# Patient Record
Sex: Female | Born: 2005 | Race: White | Hispanic: Yes | Marital: Single | Smoking: Never smoker
Health system: Southern US, Community
[De-identification: ages and names within clinical notes are randomized; demographics above are authoritative.]

## PROBLEM LIST (undated history)

## (undated) DIAGNOSIS — L309 Dermatitis, unspecified: Secondary | ICD-10-CM

## (undated) DIAGNOSIS — J45909 Unspecified asthma, uncomplicated: Secondary | ICD-10-CM

## (undated) DIAGNOSIS — J309 Allergic rhinitis, unspecified: Secondary | ICD-10-CM

## (undated) DIAGNOSIS — S3992XA Unspecified injury of lower back, initial encounter: Secondary | ICD-10-CM

## (undated) HISTORY — PX: NO PAST SURGERIES: SHX2092

## (undated) HISTORY — DX: Allergic rhinitis, unspecified: J30.9

## (undated) HISTORY — DX: Unspecified asthma, uncomplicated: J45.909

## (undated) HISTORY — DX: Unspecified injury of lower back, initial encounter: S39.92XA

## (undated) HISTORY — DX: Dermatitis, unspecified: L30.9

---
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
## 2023-01-24 ENCOUNTER — Encounter (HOSPITAL_COMMUNITY): Payer: Self-pay

## 2023-01-24 ENCOUNTER — Encounter: Payer: Self-pay | Admitting: Pediatrics

## 2023-01-24 ENCOUNTER — Other Ambulatory Visit: Payer: Self-pay | Admitting: Pediatrics

## 2023-01-24 ENCOUNTER — Other Ambulatory Visit: Payer: Self-pay

## 2023-01-24 ENCOUNTER — Ambulatory Visit (INDEPENDENT_AMBULATORY_CARE_PROVIDER_SITE_OTHER): Payer: Medicaid Other | Admitting: Pediatrics

## 2023-01-24 ENCOUNTER — Emergency Department (HOSPITAL_COMMUNITY)
Admission: EM | Admit: 2023-01-24 | Discharge: 2023-01-24 | Disposition: A | Discharge status: Home / Self Care | Payer: Medicaid Other | Attending: Emergency Medicine | Admitting: Emergency Medicine

## 2023-01-24 VITALS — Wt 136.1 lb

## 2023-01-24 DIAGNOSIS — R109 Unspecified abdominal pain: Secondary | ICD-10-CM

## 2023-01-24 DIAGNOSIS — K921 Melena: Secondary | ICD-10-CM | POA: Insufficient documentation

## 2023-01-24 DIAGNOSIS — Q181 Preauricular sinus and cyst: Secondary | ICD-10-CM | POA: Diagnosis not present

## 2023-01-24 DIAGNOSIS — J45909 Unspecified asthma, uncomplicated: Secondary | ICD-10-CM | POA: Insufficient documentation

## 2023-01-24 LAB — POCT URINALYSIS DIPSTICK
Bilirubin, UA: NEGATIVE
Blood, UA: NEGATIVE
Glucose, UA: NEGATIVE
Ketones, UA: NEGATIVE
Nitrite, UA: NEGATIVE
Protein, UA: NEGATIVE
Spec Grav, UA: 1.01 (ref 1.010–1.025)
Urobilinogen, UA: NEGATIVE E.U./dL — AB
pH, UA: 8 — AB (ref 5.0–8.0)

## 2023-01-24 LAB — CBC WITH DIFFERENTIAL/PLATELET
Abs Immature Granulocytes: 0.03 10*3/uL (ref 0.00–0.07)
Basophils Absolute: 0 10*3/uL (ref 0.0–0.1)
Basophils Relative: 0 %
Eosinophils Absolute: 0.1 10*3/uL (ref 0.0–0.5)
Eosinophils Relative: 1 %
HCT: 37.4 % (ref 36.0–46.0)
Hemoglobin: 12.8 g/dL (ref 12.0–15.0)
Immature Granulocytes: 0 %
Lymphocytes Relative: 24 %
Lymphs Abs: 2.1 10*3/uL (ref 0.7–4.0)
MCH: 31.8 pg (ref 26.0–34.0)
MCHC: 34.2 g/dL (ref 30.0–36.0)
MCV: 92.8 fL (ref 80.0–100.0)
Monocytes Absolute: 0.5 10*3/uL (ref 0.1–1.0)
Monocytes Relative: 5 %
Neutro Abs: 6.3 10*3/uL (ref 1.7–7.7)
Neutrophils Relative %: 70 %
Platelets: 223 10*3/uL (ref 150–400)
RBC: 4.03 MIL/uL (ref 3.87–5.11)
RDW: 11.6 % (ref 11.5–15.5)
WBC: 9.1 10*3/uL (ref 4.0–10.5)
nRBC: 0 % (ref 0.0–0.2)

## 2023-01-24 LAB — COMPREHENSIVE METABOLIC PANEL
ALT: 14 U/L (ref 0–44)
AST: 15 U/L (ref 15–41)
Albumin: 4.3 g/dL (ref 3.5–5.0)
Alkaline Phosphatase: 41 U/L (ref 38–126)
Anion gap: 7 (ref 5–15)
BUN: 20 mg/dL (ref 6–20)
CO2: 25 mmol/L (ref 22–32)
Calcium: 9.1 mg/dL (ref 8.9–10.3)
Chloride: 104 mmol/L (ref 98–111)
Creatinine, Ser: 0.63 mg/dL (ref 0.44–1.00)
GFR, Estimated: >60 mL/min (ref >60)
Glucose, Bld: 83 mg/dL (ref 70–99)
Potassium: 3.7 mmol/L (ref 3.5–5.1)
Sodium: 136 mmol/L (ref 135–145)
Total Bilirubin: 0.9 mg/dL (ref 0.3–1.2)
Total Protein: 7.3 g/dL (ref 6.5–8.1)

## 2023-01-24 LAB — URINALYSIS, ROUTINE W REFLEX MICROSCOPIC
Bilirubin Urine: NEGATIVE
Glucose, UA: NEGATIVE mg/dL
Hgb urine dipstick: NEGATIVE
Ketones, ur: NEGATIVE mg/dL
Leukocytes,Ua: NEGATIVE
Nitrite: NEGATIVE
Protein, ur: NEGATIVE mg/dL
Specific Gravity, Urine: 1.015 (ref 1.005–1.030)
pH: 7 (ref 5.0–8.0)

## 2023-01-24 LAB — LIPASE, BLOOD: Lipase: 23 U/L (ref 11–51)

## 2023-01-24 MED ORDER — PANTOPRAZOLE SODIUM 40 MG PO TBEC: 40.0000 mg | DELAYED_RELEASE_TABLET | Freq: Two times a day (BID) | ORAL | 0 refills | Status: AC

## 2023-01-24 MED ORDER — PANTOPRAZOLE SODIUM 40 MG IV SOLR
40.0000 mg | Freq: Once | INTRAVENOUS | Status: AC
  Administered 2023-01-24: 40 mg via INTRAVENOUS
  Filled 2023-01-24: qty 10

## 2023-01-24 NOTE — Progress Notes (Unsigned)
Subjective:    Amanda Haney is a 17 y.o. old female here with her  self  for Abdominal Pain (PATIENT STATES LACK OF BM AND SHE'S CONCERN WITH A DARK BM SUNDAY AFTERNOON ) .    HPI Chief Complaint  Patient presents with  . Abdominal Pain    PATIENT STATES LACK OF BM AND SHE'S CONCERN WITH A DARK BM SUNDAY AFTERNOON    17yo here for abdominal pain.  Yesterday pt had black stools.  Pt states she has abd pain/ stomach cramps (nonspecific) when she doesn't have a BM in a long time.  Over the past 2-3wks it has been getting worse.  Pt was seen several months ago for abd pain and constipation.  Pt went to [place] in march- and since then has not had regular BM.  Pt attempted to change her diet as well, but no changes. She has been using miralax for BM.  She has not used in 65mo.  Yesterday she had a BM that was very black.  She has noticed a different smell to her stools.  Pt denies being sexually active.  Pt denies eating lots of acidic or spicy foods.  Pt has been eating more yogurts  Pt does have weight loss-  but she has been eating better and working out more.    Bump behind her L ear x 6mos. Pt states it does not bother her, but it is getting bigger.    Review of Systems  History and Problem List: Amanda Haney has Allergic rhinitis; Family history of elevated blood lipids; Verruca plantaris; Insomnia; Acne vulgaris; Mild intermittent asthma without complication; Sleep-disordered breathing; and Tonsillar hypertrophy on their problem list.  Amanda Haney  has a past medical history of Allergic rhinitis and Asthma.  Immunizations needed: {NONE DEFAULTED:18576}     Objective:    Wt 136 lb 2 oz (61.7 kg)   BMI 24.11 kg/m  Physical Exam Constitutional:      Appearance: She is well-developed.  HENT:     Head:     Comments: Behind L ear- superior - soft, nonmobile, fluctuance 1cm x 1cm,  nontender, nonerythematous.    Right Ear: Tympanic membrane and external ear normal.     Left Ear: Tympanic membrane  and external ear normal.     Nose: Nose normal.     Mouth/Throat:     Mouth: Mucous membranes are moist.  Eyes:     Pupils: Pupils are equal, round, and reactive to light.  Cardiovascular:     Rate and Rhythm: Normal rate and regular rhythm.     Pulses: Normal pulses.     Heart sounds: Normal heart sounds.  Pulmonary:     Effort: Pulmonary effort is normal.     Breath sounds: Normal breath sounds.  Abdominal:     General: Bowel sounds are normal.     Palpations: Abdomen is soft.     Tenderness: There is generalized abdominal tenderness.  Genitourinary:    Adnexa:        Right: Masses: but predominantly periumbilical.   Musculoskeletal:        General: Normal range of motion.     Cervical back: Normal range of motion.  Skin:    Capillary Refill: Capillary refill takes less than 2 seconds.  Neurological:     Mental Status: She is alert.  Psychiatric:        Mood and Affect: Mood normal.       Assessment and Plan:   Amanda Haney is a 17 y.o.  old female with  1. Abdominal pain, unspecified abdominal location *** - POCT urinalysis dipstick - POCT occult blood stool - CALPROTECTIN - H. pylori antigen, stool  2. Melena *** - POCT occult blood stool - Ambulatory referral to Gastroenterology  3. Cyst on ear *** - Ambulatory referral to Pediatric Dermatology  Advised to go to ER for further eval. Pt has positive hemoccult.  Tarry stools obtained today to send out for calprotectin, h. Pylori.  Pt may need CT abdomen or upper endoscopy to rule out UGI bleed.   No follow-ups on file.  Marjory Sneddon, MD

## 2023-01-24 NOTE — ED Notes (Signed)
Pt alert, NAD, calm, interactive, resps e/u. Endorses black stool with hemoccult positive. Instructed to come to ED. Denies pain, sob, nausea, dizziness, NVD, fever, recent foreign travel.

## 2023-01-24 NOTE — ED Provider Notes (Signed)
[place] EMERGENCY DEPARTMENT AT [facility] Provider Note   CSN: [id number] Arrival date & time: 01/24/23  1240     History  Chief Complaint  Patient presents with   Abdominal Pain   Blood In Stools    Amanda Haney is a 17 y.o. female.  18 year old female presents with her mom for evaluation of melanotic stools from PCP office.  She had a FOBT positive test.  She states for the past couple days she has had some cramping abdominal pain and yesterday she noted melanotic stools.  Denies dysuria or other GU symptoms.  No prior history of melanotic stools, GI bleed.  She states she does have some pain with defecating and some bright red blood with wiping.  Denies use of NSAIDs, and any over-the-counter supplements including iron.  The history is provided by the patient. No language interpreter was used.       Home Medications Prior to Admission medications   Medication Sig Start Date End Date Taking? Authorizing Provider  albuterol (VENTOLIN HFA) 108 (90 Base) MCG/ACT inhaler Inhale 2 puffs into the lungs every 6 (six) hours as needed for wheezing or shortness of breath. 08/10/22   Waldon Merl, PA-C  cetirizine (ZYRTEC) 10 MG tablet TAKE 1 TABLET(10 MG) BY MOUTH DAILY Patient not taking: Reported on 01/24/2023 05/12/21   Ettefagh, Aron Baba, MD  fluticasone [facility]) 50 MCG/ACT nasal spray Place 2 sprays into both nostrils daily. 05/21/22   Delorse Lek, FNP  methylPREDNISolone (MEDROL DOSEPAK) 4 MG TBPK tablet Follow instructions on the pamphlet. Patient not taking: Reported on 01/24/2023 11/10/22   Scarlette Ar, MD  polyethylene glycol powder (GLYCOLAX/MIRALAX) 17 GM/SCOOP powder Mix 1 capful in 8 oz of fluids and drink 2 times a day until soft stool and titrate dose as instructed by physician 09/24/22   Maree Erie, MD      Allergies    Patient has no known allergies.    Review of Systems   Review of Systems  Constitutional:  Negative for fever.   Gastrointestinal:  Positive for abdominal pain, constipation, nausea and vomiting.  Genitourinary:  Negative for dysuria and flank pain.  Neurological:  Negative for light-headedness.  All other systems reviewed and are negative.   Physical Exam Updated Vital Signs BP 134/82 (BP Location: Left Arm)   Pulse 82   Temp 97.8 F (36.6 C) (Oral)   Resp 14   Ht 5\' 3"  (1.6 m)   Wt 61.7 kg   LMP 01/04/2023   SpO2 100%   BMI 24.10 kg/m  Physical Exam Vitals and nursing note reviewed.  Constitutional:      General: She is not in acute distress.    Appearance: Normal appearance. She is not ill-appearing.  HENT:     Head: Normocephalic and atraumatic.     Nose: Nose normal.  Eyes:     General: No scleral icterus.    Extraocular Movements: Extraocular movements intact.     Conjunctiva/sclera: Conjunctivae normal.  Cardiovascular:     Rate and Rhythm: Normal rate and regular rhythm.     Pulses: Normal pulses.     Heart sounds: Normal heart sounds.  Pulmonary:     Effort: Pulmonary effort is normal. No respiratory distress.     Breath sounds: Normal breath sounds. No wheezing or rales.  Abdominal:     General: There is no distension.     Tenderness: There is abdominal tenderness. There is no right CVA tenderness, left CVA tenderness,  guarding or rebound.  Musculoskeletal:        General: Normal range of motion.     Cervical back: Normal range of motion.  Skin:    General: Skin is warm and dry.  Neurological:     General: No focal deficit present.     Mental Status: She is alert and oriented to person, place, and time. Mental status is at baseline.     ED Results / Procedures / Treatments   Labs (all labs ordered are listed, but only abnormal results are displayed) Labs Reviewed  CBC WITH DIFFERENTIAL/PLATELET  COMPREHENSIVE METABOLIC PANEL  LIPASE, BLOOD  URINALYSIS, ROUTINE W REFLEX MICROSCOPIC    EKG None  Radiology No results found.  Procedures Procedures     Medications Ordered in ED Medications - No data to display  ED Course/ Medical Decision Making/ A&P                             Medical Decision Making Amount and/or Complexity of Data Reviewed Labs: ordered.  Risk Prescription drug management.   Medical Decision Making / ED Course   This patient presents to the ED for concern of abdominal pain, melanotic stools, this involves an extensive number of treatment options, and is a complaint that carries with it a high risk of complications and morbidity.  The differential diagnosis includes upper GI bleed, hemorrhoids, anal fissure  MDM: -year-old female presents today for evaluation of abdominal pain, melanotic stools for the past couple days.  Seen by PCP and referred to the emergency department after fecal occult blood test came back positive.  Has pictures on her phone that she shows me of her melanotic stools.  They are tarry in appearance.  Initial vitals are stable.  No sign of hemodynamic instability.  Denies lightheadedness.  CBC is unremarkable.  CMP is unremarkable.  UA without any concerns of UTI.  Lipase within normal limit.  Will discuss with gastroenterology and hopes to get her plugged into our office sooner rather than later.  Discussed with Dr. Ewing Schlein of gastroenterology.  Recommends discharge on Protonix, clear liquid diet, and to call his office in the morning for an urgent visit.  Patient and mom agreeable with this.  Discharged in appropriate condition.  Strict return precautions discussed.  Lab Tests: -I ordered, reviewed, and interpreted labs.   The pertinent results include:   Labs Reviewed  CBC WITH DIFFERENTIAL/PLATELET  COMPREHENSIVE METABOLIC PANEL  LIPASE, BLOOD  URINALYSIS, ROUTINE W REFLEX MICROSCOPIC      EKG  EKG Interpretation Date/Time:    Ventricular Rate:    PR Interval:    QRS Duration:    QT Interval:    QTC Calculation:   R Axis:      Text Interpretation:           Medicines ordered and prescription drug management: No orders of the defined types were placed in this encounter.   -I have reviewed the patients home medicines and have made adjustments as needed  Critical interventions Protonix, GI consult  Consultations Obtained: I requested consultation with the GI ,  and discussed lab and imaging findings as well as pertinent plan - they recommend: as above   Reevaluation: After the interventions noted above, I reevaluated the patient and found that they have :stayed the same  Co morbidities that complicate the patient evaluation  Past Medical History:  Diagnosis Date   Allergic rhinitis    Asthma  Dispostion: Discharged in stable condition.  Return precaution discussed.  Patient voices understanding and is in agreement with plan.  Final Clinical Impression(s) / ED Diagnoses Final diagnoses:  Melanotic stools    Rx / DC Orders ED Discharge Orders          Ordered    pantoprazole (PROTONIX) 40 MG tablet  2 times daily before meals        01/24/23 1812              Marita [place], PA-C 01/24/23 1815    Gerhard Munch, MD 01/26/23 [id number]

## 2023-01-24 NOTE — Discharge Instructions (Addendum)
Your workup today was overall reassuring.  No concerning cause of significant internal bleeding.  Your hemoglobin was stable.  I discussed your case with the on-call gastroenterologist Dr. Ewing Schlein.  He recommended prescribing you Protonix, and having you call his office tomorrow morning for an urgent office visit.  In the meantime please stick to a clear liquid diet. His information is listed above.  For any concerning symptoms return to the emergency department.

## 2023-01-24 NOTE — ED Triage Notes (Signed)
Pt arrived from pcp office. States was there due to black tools x2 days.States stool was positive for blood and pcp sent her to ED for further eval. Endorses abdominal pain, lower for the last few days. States has also been feeling fatigue. Denies shob,fever,cp or any other symptoms

## 2023-01-25 DIAGNOSIS — K921 Melena: Secondary | ICD-10-CM | POA: Diagnosis not present

## 2023-01-25 DIAGNOSIS — K5900 Constipation, unspecified: Secondary | ICD-10-CM | POA: Diagnosis not present

## 2023-01-25 DIAGNOSIS — R195 Other fecal abnormalities: Secondary | ICD-10-CM | POA: Diagnosis not present

## 2023-01-27 DIAGNOSIS — B9681 Helicobacter pylori [H. pylori] as the cause of diseases classified elsewhere: Secondary | ICD-10-CM | POA: Diagnosis not present

## 2023-01-27 DIAGNOSIS — R1013 Epigastric pain: Secondary | ICD-10-CM | POA: Diagnosis not present

## 2023-01-27 DIAGNOSIS — K2930 Chronic superficial gastritis without bleeding: Secondary | ICD-10-CM | POA: Diagnosis not present

## 2023-01-27 DIAGNOSIS — K922 Gastrointestinal hemorrhage, unspecified: Secondary | ICD-10-CM | POA: Diagnosis not present

## 2023-01-27 DIAGNOSIS — K269 Duodenal ulcer, unspecified as acute or chronic, without hemorrhage or perforation: Secondary | ICD-10-CM | POA: Diagnosis not present

## 2023-02-09 ENCOUNTER — Ambulatory Visit: Payer: Medicaid Other | Admitting: Medical

## 2023-02-17 ENCOUNTER — Ambulatory Visit: Payer: Medicaid Other | Admitting: Nurse Practitioner

## 2023-02-24 ENCOUNTER — Ambulatory Visit: Payer: Medicaid Other | Admitting: Dermatology

## 2023-04-06 ENCOUNTER — Ambulatory Visit: Payer: Medicaid Other | Admitting: Nurse Practitioner

## 2023-05-23 ENCOUNTER — Ambulatory Visit: Payer: Medicaid Other | Admitting: Dermatology

## 2023-06-18 IMAGING — US US PELVIS COMPLETE WITH TRANSVAGINAL
1 series · 14 of 25 positions shown · non-contrast
Comparison: None Available.

CLINICAL DATA: Pelvic pain.

EXAM:
TRANSABDOMINAL ULTRASOUND OF PELVIS
DOPPLER ULTRASOUND OF OVARIES
TECHNIQUE: Transabdominal ultrasound examination of the pelvis was performed
including evaluation of the uterus, ovaries, adnexal regions, and
pelvic cul-de-sac.
Color and duplex Doppler ultrasound was utilized to evaluate blood
flow to the ovaries.
Transabdominal ultrasound examination of the pelvis was performed
pelvic cul-de-sac. Patient refused trans vaginal examination.

[Series 1: us pelvis complete with transvaginal · 14 of 50 slices shown]
[im 1/50]
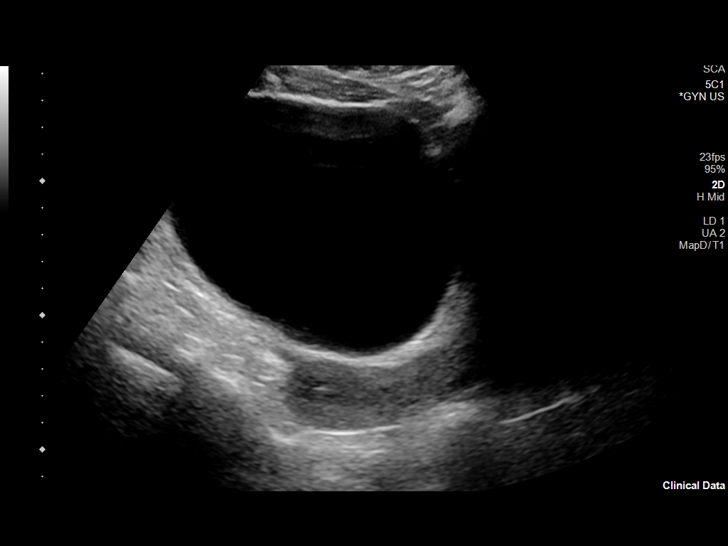
[im 5/50]
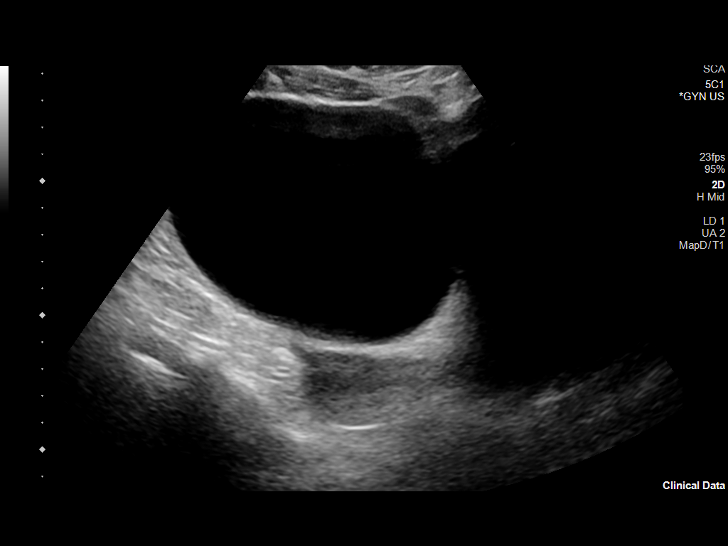
[im 9/50]
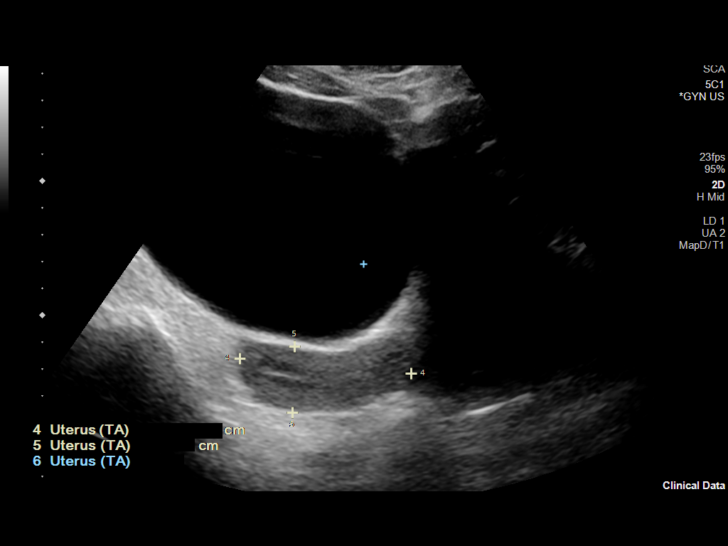
[im 13/50]
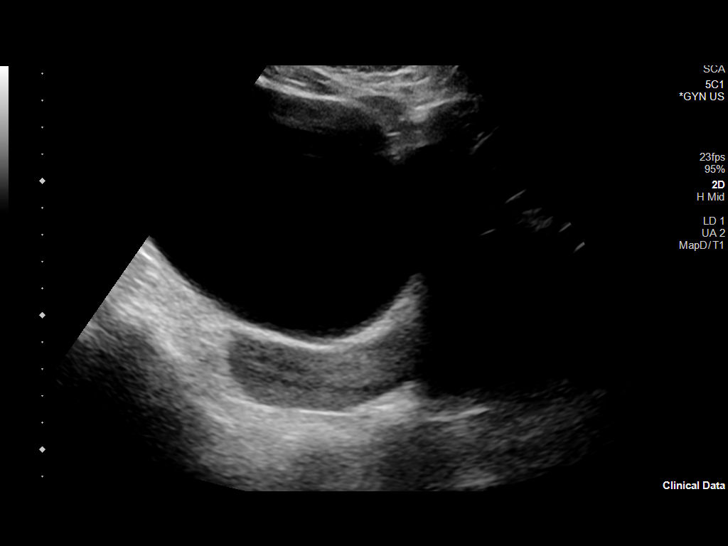
[im 17/50]
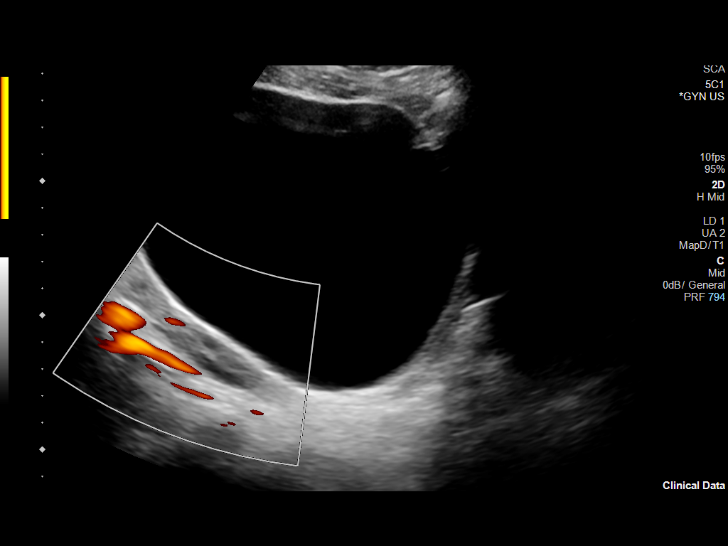
[im 19/50]
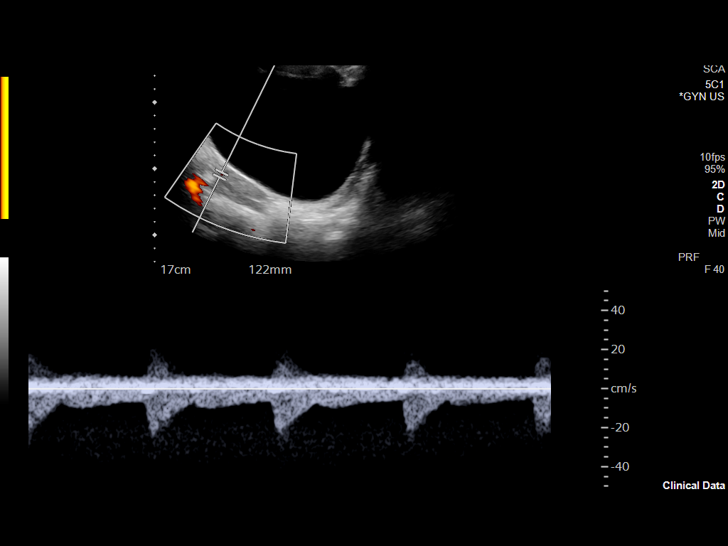
[im 23/50]
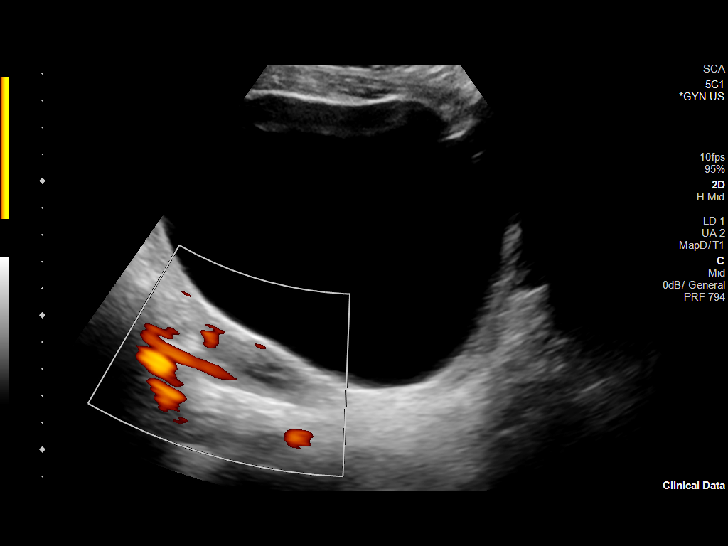
[im 27/50]
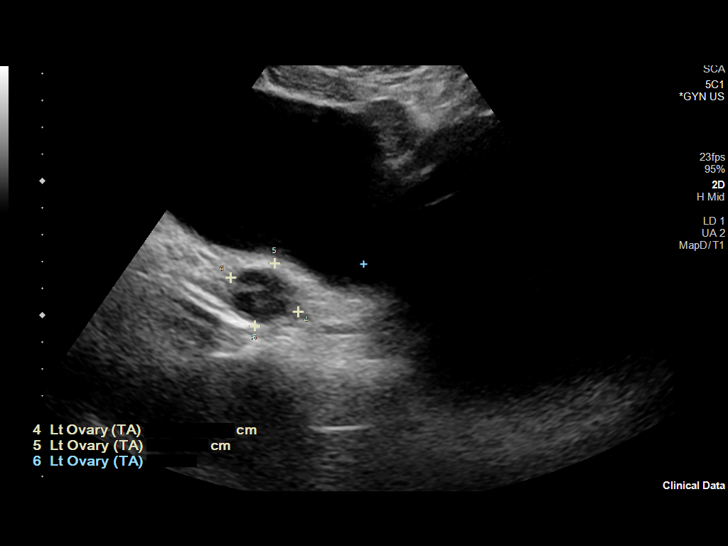
[im 31/50]
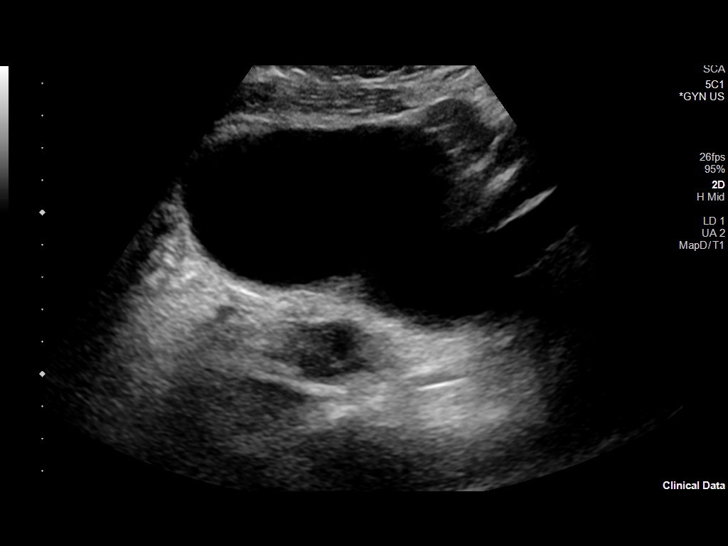
[im 33/50]
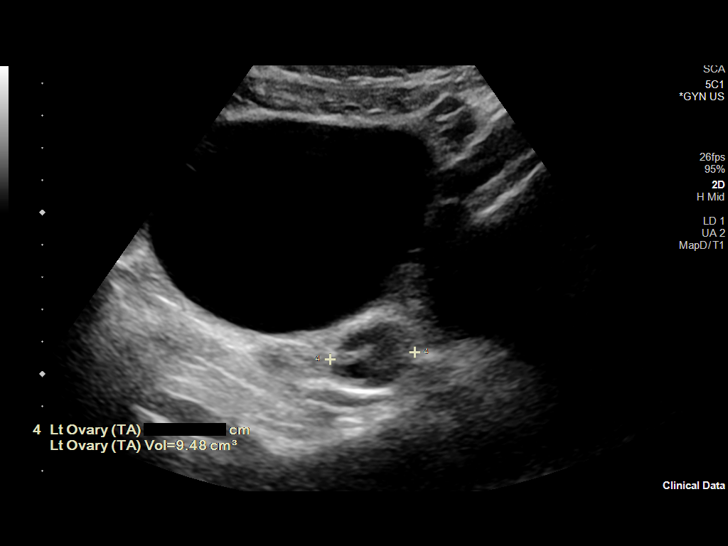
[im 37/50]
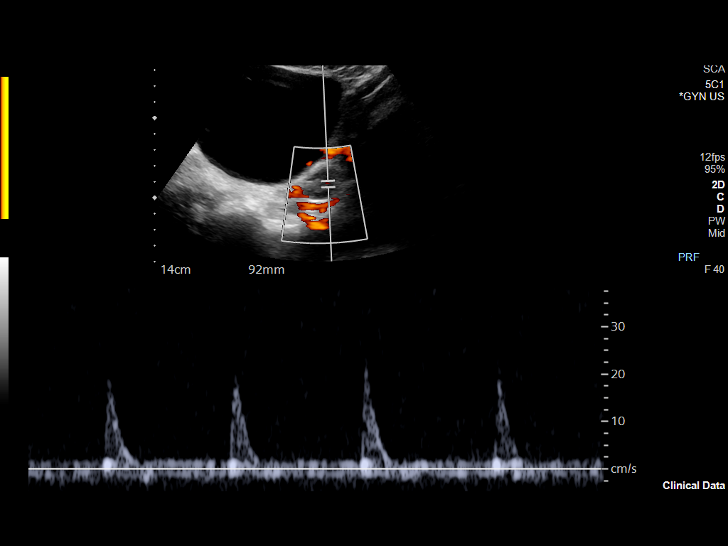
[im 41/50]
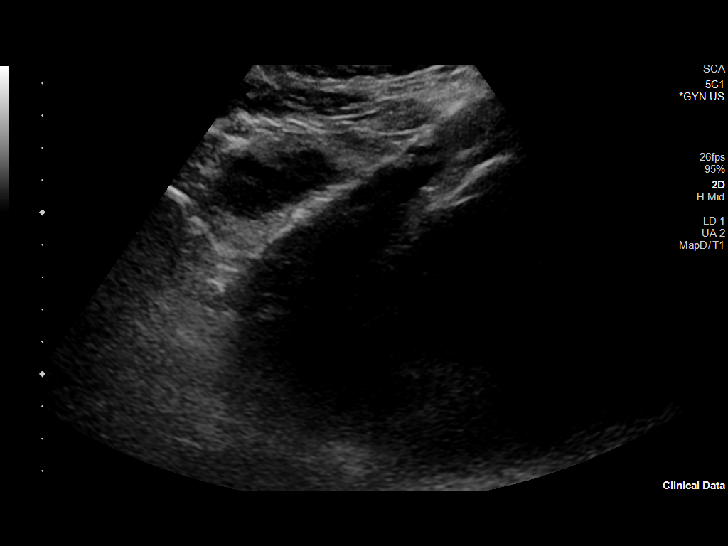
[im 45/50]
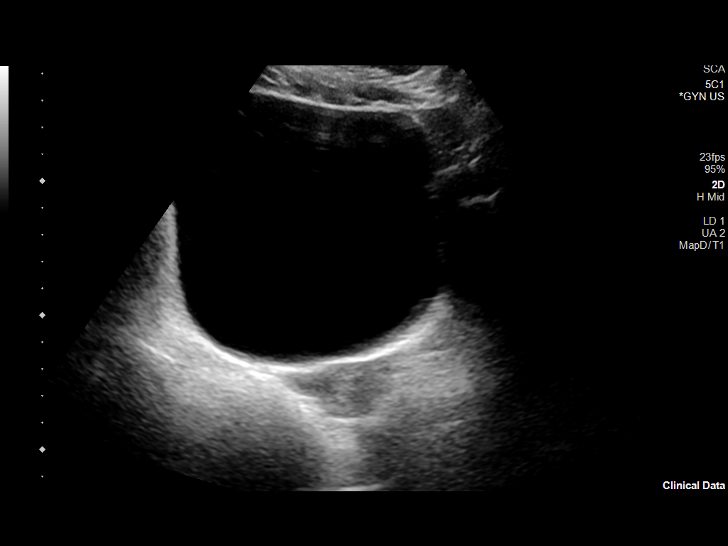
[im 50/50]
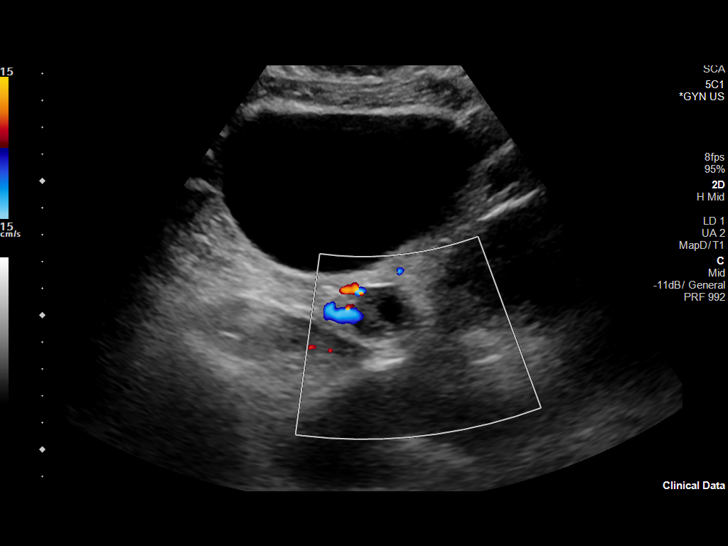

[14 of 25 positions shown; findings below may reference images not displayed]

FINDINGS: Uterus:

Measurements: 6.4 x 2.5 x 3.3 = volume: 26.9 mL. No fibroids or
other mass visualized.

Endometrium

Thickness: 5 mm.  No focal abnormality visualized.

Right ovary

Measurements: 4.9 x 1.3 x 2.2 = volume: 7.6 mL. Normal appearance/no
adnexal mass.

Left ovary

Measurements: 2.8 x 2.4 x 2.6 = volume: 9.5 mL. Normal appearance/no
adnexal mass.

Pulsed Doppler evaluation demonstrates normal low-resistance
arterial and venous waveforms in both ovaries.

Other: No free fluid in the dependent pelvis.
IMPRESSION: 1.  Bilateral ovaries are normal in size with normal blood flow.

2.  Normal examination of the uterus.

## 2023-08-05 ENCOUNTER — Emergency Department (HOSPITAL_BASED_OUTPATIENT_CLINIC_OR_DEPARTMENT_OTHER): Payer: Medicaid Other

## 2023-08-05 ENCOUNTER — Emergency Department (HOSPITAL_BASED_OUTPATIENT_CLINIC_OR_DEPARTMENT_OTHER)
Admission: EM | Admit: 2023-08-05 | Discharge: 2023-08-05 | Disposition: A | Discharge status: Home / Self Care | Payer: Medicaid Other | Attending: Emergency Medicine | Admitting: Emergency Medicine

## 2023-08-05 ENCOUNTER — Other Ambulatory Visit: Payer: Self-pay

## 2023-08-05 ENCOUNTER — Ambulatory Visit (INDEPENDENT_AMBULATORY_CARE_PROVIDER_SITE_OTHER): Payer: Medicaid Other | Admitting: Pediatrics

## 2023-08-05 ENCOUNTER — Encounter (HOSPITAL_BASED_OUTPATIENT_CLINIC_OR_DEPARTMENT_OTHER): Payer: Self-pay | Admitting: Emergency Medicine

## 2023-08-05 VITALS — Temp 98.5°F | Wt 140.4 lb

## 2023-08-05 DIAGNOSIS — R1032 Left lower quadrant pain: Secondary | ICD-10-CM | POA: Diagnosis not present

## 2023-08-05 DIAGNOSIS — R112 Nausea with vomiting, unspecified: Secondary | ICD-10-CM | POA: Insufficient documentation

## 2023-08-05 DIAGNOSIS — R509 Fever, unspecified: Secondary | ICD-10-CM | POA: Diagnosis not present

## 2023-08-05 DIAGNOSIS — Z3202 Encounter for pregnancy test, result negative: Secondary | ICD-10-CM | POA: Diagnosis not present

## 2023-08-05 DIAGNOSIS — R197 Diarrhea, unspecified: Secondary | ICD-10-CM | POA: Diagnosis not present

## 2023-08-05 LAB — CBC
HCT: 40.1 % (ref 36.0–46.0)
Hemoglobin: 13.9 g/dL (ref 12.0–15.0)
MCH: 29.1 pg (ref 26.0–34.0)
MCHC: 34.7 g/dL (ref 30.0–36.0)
MCV: 84.1 fL (ref 80.0–100.0)
Platelets: 227 10*3/uL (ref 150–400)
RBC: 4.77 MIL/uL (ref 3.87–5.11)
RDW: 13.2 % (ref 11.5–15.5)
WBC: 10.1 10*3/uL (ref 4.0–10.5)
nRBC: 0 % (ref 0.0–0.2)

## 2023-08-05 LAB — COMPREHENSIVE METABOLIC PANEL
ALT: 23 U/L (ref 0–44)
AST: 64 U/L — ABNORMAL HIGH (ref 15–41)
Albumin: 4.2 g/dL (ref 3.5–5.0)
Alkaline Phosphatase: 48 U/L (ref 38–126)
Anion gap: 10 (ref 5–15)
BUN: 13 mg/dL (ref 6–20)
CO2: 23 mmol/L (ref 22–32)
Calcium: 8.8 mg/dL — ABNORMAL LOW (ref 8.9–10.3)
Chloride: 100 mmol/L (ref 98–111)
Creatinine, Ser: 0.68 mg/dL (ref 0.44–1.00)
GFR, Estimated: >60 mL/min (ref >60)
Glucose, Bld: 101 mg/dL — ABNORMAL HIGH (ref 70–99)
Potassium: 3.5 mmol/L (ref 3.5–5.1)
Sodium: 133 mmol/L — ABNORMAL LOW (ref 135–145)
Total Bilirubin: 1.2 mg/dL (ref 0.0–1.2)
Total Protein: 7.5 g/dL (ref 6.5–8.1)

## 2023-08-05 LAB — POCT URINALYSIS DIPSTICK
Bilirubin, UA: NEGATIVE
Blood, UA: POSITIVE
Glucose, UA: NEGATIVE
Ketones, UA: 50
Leukocytes, UA: NEGATIVE
Nitrite, UA: NEGATIVE
Protein, UA: NEGATIVE
Spec Grav, UA: 1.025 (ref 1.010–1.025)
Urobilinogen, UA: NEGATIVE U/dL — AB
pH, UA: 5 (ref 5.0–8.0)

## 2023-08-05 LAB — POC SOFIA 2 FLU + SARS ANTIGEN FIA
Influenza A, POC: NEGATIVE
Influenza B, POC: NEGATIVE
SARS Coronavirus 2 Ag: NEGATIVE

## 2023-08-05 LAB — POCT URINE PREGNANCY: Preg Test, Ur: NEGATIVE

## 2023-08-05 LAB — LIPASE, BLOOD: Lipase: 21 U/L (ref 11–51)

## 2023-08-05 LAB — POCT RAPID STREP A (OFFICE): Rapid Strep A Screen: NEGATIVE

## 2023-08-05 MED ORDER — ONDANSETRON HCL 4 MG/2ML IJ SOLN
4.0000 mg | Freq: Once | INTRAMUSCULAR | Status: DC
  Filled 2023-08-05: qty 2

## 2023-08-05 MED ORDER — ACETAMINOPHEN 500 MG PO TABS: 500.0000 mg | ORAL_TABLET | Freq: Four times a day (QID) | ORAL | 0 refills | Status: AC | PRN

## 2023-08-05 MED ORDER — ACETAMINOPHEN 500 MG PO TABS
1000.0000 mg | ORAL_TABLET | Freq: Once | ORAL | Status: AC
  Administered 2023-08-05: 1000 mg via ORAL
  Filled 2023-08-05: qty 2

## 2023-08-05 MED ORDER — SODIUM CHLORIDE 0.9 % IV BOLUS
1000.0000 mL | Freq: Once | INTRAVENOUS | Status: AC
  Administered 2023-08-05: 1000 mL via INTRAVENOUS

## 2023-08-05 MED ORDER — ONDANSETRON HCL 4 MG PO TABS: 4.0000 mg | ORAL_TABLET | Freq: Three times a day (TID) | ORAL | 0 refills | Status: AC | PRN

## 2023-08-05 MED ORDER — IOHEXOL 300 MG/ML  SOLN
100.0000 mL | Freq: Once | INTRAMUSCULAR | Status: AC | PRN
  Administered 2023-08-05: 100 mL via INTRAVENOUS

## 2023-08-05 NOTE — Patient Instructions (Addendum)
Pt is advised to go to ER for further evaluation.  Pt c/o LL abdominal pain in the setting of fever, vomiting and diarrhea.  Can not rule out constipation vs ovarian cyst vs ovarian torsion vs renal calicul in clinic.

## 2023-08-05 NOTE — ED Triage Notes (Addendum)
 PtPOV c/o lower abd pain, back pain, fever, diarrhea, emesis x1 day. Seen at pcp today, referred to ED.   UA/ resp panel obtained at PCP, results available.

## 2023-08-05 NOTE — Progress Notes (Signed)
 Subjective:    Amanda Haney is a 18 y.o. old female here with her mother for Emesis (Vomiting and diarrhea pain in lower left abdomin and lower back, warm to touch all symptoms started last night. Also body chill feels dehydrated. ) .    HPI Chief Complaint  Patient presents with   Emesis    Vomiting and diarrhea pain in lower left abdomin and lower back, warm to touch all symptoms started last night. Also body chill feels dehydrated.    17yo here for V/D since last night.  Pt c/o LL abd pain.  Hurts to touch and feels in LL back. Now starting to have mild right sided pain.  No vomiting today.  And fevers. No tyl/motrin  given.  Pt has had HA.  Pt has h/o constipation  Yesterday ate avocado,bean, corn dip w/ chips, apple, keto bar.    Review of Systems  Constitutional:  Positive for appetite change.    History and Problem List: Amanda Haney has Allergic rhinitis; Family history of elevated blood lipids; Verruca plantaris; Insomnia; Acne vulgaris; Mild intermittent asthma without complication; Sleep-disordered breathing; and Tonsillar hypertrophy on their problem list.  Amanda Haney  has a past medical history of Allergic rhinitis and Asthma.  Immunizations needed: none     Objective:    Temp 98.5 F (36.9 C) (Oral)   Wt 140 lb 6.4 oz (63.7 kg)   BMI 24.87 kg/m  Physical Exam Constitutional:      Appearance: She is well-developed.  HENT:     Right Ear: Tympanic membrane and external ear normal.     Left Ear: Tympanic membrane and external ear normal.     Nose: Nose normal.     Mouth/Throat:     Mouth: Mucous membranes are moist.  Eyes:     Pupils: Pupils are equal, round, and reactive to light.  Cardiovascular:     Rate and Rhythm: Normal rate and regular rhythm.     Pulses: Normal pulses.     Heart sounds: Normal heart sounds.  Pulmonary:     Effort: Pulmonary effort is normal.     Breath sounds: Normal breath sounds.  Abdominal:     General: Bowel sounds are normal.      Palpations: Abdomen is soft.  Musculoskeletal:        General: Normal range of motion.     Cervical back: Normal range of motion.  Skin:    Capillary Refill: Capillary refill takes less than 2 seconds.  Neurological:     Mental Status: She is alert.  Psychiatric:        Mood and Affect: Mood normal.        Assessment and Plan:   Amanda Haney is a 18 y.o. old female with  1. Nausea and vomiting, unspecified vomiting type (Primary) Patient presents with signs / symptoms of vomiting. Clinical work up did not reveal a specific etiology of the vomiting.  I discussed the differential diagnosis and work up of vomiting with patient / caregiver. Supportive care recommended, but pt advised to go to ER at this time. Patient remained clinically stable at time of discharge.   Patient / caregiver advised to have medical re-evaluation if symptoms worsen or persist, or if new symptoms develop over the next 24-48 hours.  Pt is advised to go to ER for further evaluation.  Pt c/o LL abdominal pain in the setting of fever, vomiting and diarrhea.  Can not rule out dehydration vs constipation vs ovarian cyst vs ovarian torsion vs renal  calicul vs PID vs pyelo in clinic.    Urinalysis- negative for leuk/nitrites, Urine pregnancy- negative POC strep, flu, covid- NEG.   - POCT urine pregnancy - POCT urinalysis dipstick  2. Fever, unspecified fever cause  - POC SOFIA 2 FLU + SARS ANTIGEN FIA-NEG - POCT rapid strep A-NEG    No follow-ups on file.  [place] R Christobal Morado, MD

## 2023-08-05 NOTE — Discharge Instructions (Addendum)
 Your symptom is likely due to a viral gastroenteritis such as norovirus.  Take Zofran  as needed for nausea, take Tylenol  as needed for aches and pain and follow-up closely with your doctor for further care.

## 2023-08-05 NOTE — ED Notes (Signed)
 Pt alert and oriented X 4 at the time of discharge. RR even and unlabored. No acute distress noted. Pt verbalized understanding of discharge instructions as discussed. Pt ambulatory to lobby at time of discharge.

## 2023-08-05 NOTE — ED Provider Notes (Signed)
 [place] [facility] Provider Note   CSN: [id number] Arrival date & time: 08/05/23  1224     History  Chief Complaint  Patient presents with   Abdominal Pain    Amanda Haney is a 18 y.o. female.  The history is provided by the patient and medical records. No language interpreter was used.  Abdominal Pain    18 year old female presenting with complaint of abdominal pain.  Patient report for the past 2 days she has had subjective fever, chills, body aches, headache, nausea vomiting diarrhea and pain to her left lower abdomen.  Pain is described as a cramping sensation radiates to her lower back.  She feels dehydrated.  She just started on her menstruation recently.  She was seen at her PCP office today for her complaint.  States that respiratory panel as well as UA was obtained as well as pregnancy test and all of which was negative.  However her daughter voiced concern regarding her symptoms and recommend patient to come to the ER for further assessment.  Patient states she is not sexually active and voiced no concern for sexual transmitted infection  Home Medications Prior to Admission medications   Medication Sig Start Date End Date Taking? Authorizing Provider  albuterol  (VENTOLIN  HFA) 108 (90 Base) MCG/ACT inhaler Inhale 2 puffs into the lungs every 6 (six) hours as needed for wheezing or shortness of breath. 08/10/22   Gladis Elsie BROCKS, PA-C  cetirizine  (ZYRTEC ) 10 MG tablet TAKE 1 TABLET(10 MG) BY MOUTH DAILY Patient not taking: Reported on 08/05/2023 05/12/21   Ettefagh, Mallie Hamilton, MD  fluticasone  (FLONASE ) 50 MCG/ACT nasal spray Place 2 sprays into both nostrils daily. Patient not taking: Reported on 08/05/2023 05/21/22   Almeda Loa ORN, FNP  methylPREDNISolone  (MEDROL  DOSEPAK) 4 MG TBPK tablet Follow instructions on the pamphlet. Patient not taking: Reported on 08/05/2023 11/10/22   Luciano Standing, MD  pantoprazole  (PROTONIX ) 40 MG tablet Take 1  tablet (40 mg total) by mouth 2 (two) times daily before a meal. Patient not taking: Reported on 08/05/2023 01/24/23   Hildegard Loge, PA-C  polyethylene glycol powder (GLYCOLAX /MIRALAX ) 17 GM/SCOOP powder Mix 1 capful in 8 oz of fluids and drink 2 times a day until soft stool and titrate dose as instructed by physician Patient not taking: Reported on 08/05/2023 09/24/22   Taft Jon PARAS, MD      Allergies    Patient has no known allergies.    Review of Systems   Review of Systems  Gastrointestinal:  Positive for abdominal pain.  All other systems reviewed and are negative.   Physical Exam Updated Vital Signs BP 105/82 (BP Location: Right Arm)   Pulse (!) 107   Temp 98.9 F (37.2 C) (Oral)   Resp 17   Ht 5' 2 (1.575 m)   Wt 63.5 kg   LMP 08/04/2023 (Exact Date)   SpO2 100%   BMI 25.61 kg/m  Physical Exam Vitals and nursing note reviewed.  Constitutional:      General: She is not in acute distress.    Appearance: She is well-developed.  HENT:     Head: Atraumatic.  Eyes:     Conjunctiva/sclera: Conjunctivae normal.  Cardiovascular:     Rate and Rhythm: Regular rhythm. Tachycardia present.     Pulses: Normal pulses.     Heart sounds: Normal heart sounds.  Pulmonary:     Effort: Pulmonary effort is normal.  Abdominal:     Palpations: Abdomen is  soft.     Tenderness: There is abdominal tenderness (Mild tenderness to left lower quadrant no guarding no rebound tenderness).  Genitourinary:    Comments: Pelvic exam deferred Musculoskeletal:     Cervical back: Neck supple.  Skin:    Findings: No rash.  Neurological:     Mental Status: She is alert.  Psychiatric:        Mood and Affect: Mood normal.     ED Results / Procedures / Treatments   Labs (all labs ordered are listed, but only abnormal results are displayed) Labs Reviewed  COMPREHENSIVE METABOLIC PANEL - Abnormal; Notable for the following components:      Result Value   Sodium 133 (*)    Glucose, Bld 101 (*)     Calcium 8.8 (*)    AST 64 (*)    All other components within normal limits  LIPASE, BLOOD  CBC    EKG None  Radiology CT ABDOMEN PELVIS W CONTRAST Result Date: 08/05/2023 CLINICAL DATA:  Left lower quadrant pain beginning yesterday. Nausea and vomiting. Diarrhea. EXAM: CT ABDOMEN AND PELVIS WITH CONTRAST TECHNIQUE: Multidetector CT imaging of the abdomen and pelvis was performed using the standard protocol following bolus administration of intravenous contrast. RADIATION DOSE REDUCTION: This exam was performed according to the departmental dose-optimization program which includes automated exposure control, adjustment of the mA and/or kV according to patient size and/or use of iterative reconstruction technique. CONTRAST:  OMNIPAQUE  IOHEXOL  300 MG/ML  SOLN COMPARISON:  None Available. FINDINGS: Lower Chest: No acute findings. Hepatobiliary: No suspicious hepatic masses identified. Gallbladder is unremarkable. No evidence of biliary ductal dilatation. Pancreas:  No mass or inflammatory changes. Spleen: Within normal limits in size and appearance. Adrenals/Urinary Tract: No suspicious masses identified. No evidence of ureteral calculi or hydronephrosis. Stomach/Bowel: No evidence of obstruction, inflammatory process or abnormal fluid collections. Normal appendix visualized. Vascular/Lymphatic: No pathologically enlarged lymph nodes. No acute vascular findings. Reproductive:  No mass or other significant abnormality. Other:  None. Musculoskeletal:  No suspicious bone lesions identified. IMPRESSION: Negative. No acute findings or other significant abnormality. Electronically Signed   By: Norleen DELENA Kil M.D.   On: 08/05/2023 16:34    Procedures Procedures    Medications Ordered in ED Medications  ondansetron  (ZOFRAN ) injection 4 mg (0 mg Intravenous Hold 08/05/23 1402)  sodium chloride  0.9 % bolus 1,000 mL (0 mLs Intravenous Stopped 08/05/23 1601)  acetaminophen  (TYLENOL ) tablet 1,000 mg (1,000  mg Oral Given 08/05/23 1402)  iohexol  (OMNIPAQUE ) 300 MG/ML solution 100 mL (100 mLs Intravenous Contrast Given 08/05/23 1530)    ED Course/ Medical Decision Making/ A&P                                 Medical Decision Making Amount and/or Complexity of Data Reviewed Labs: ordered. Radiology: ordered.  Risk OTC drugs. Prescription drug management.   BP 105/82 (BP Location: Right Arm)   Pulse (!) 107   Temp 98.9 F (37.2 C) (Oral)   Resp 17   Ht 5' 2 (1.575 m)   Wt 63.5 kg   LMP 08/04/2023 (Exact Date)   SpO2 100%   BMI 25.61 kg/m   55:58 PM  18 year old female presenting with complaint of abdominal pain.  Patient report for the past 2 days she has had subjective fever, chills, body aches, headache, nausea vomiting diarrhea and pain to her left lower abdomen.  Pain is described as a cramping sensation  radiates to her lower back.  She feels dehydrated.  She just started on her menstruation recently.  She was seen at her PCP office today for her complaint.  States that respiratory panel as well as UA was obtained as well as pregnancy test and all of which was negative.  However her daughter voiced concern regarding her symptoms and recommend patient to come to the ER for further assessment.  Patient states she is not sexually active and voiced no concern for sexual transmitted infection  On exam patient is well-appearing and appears to be in no acute discomfort.  She does have some mild tenderness to her left lower abdomen without any guarding or rebound tenderness.  Negative Murphy sign, no pain at McBurney's point, no CVA tenderness.  -Labs ordered, independently viewed and interpreted by me.  Labs remarkable for normal WBC, normal H&H, electrolyte panels are reassuring, normal lipase -The patient was maintained on a cardiac monitor.  I personally viewed and interpreted the cardiac monitored which showed an underlying rhythm of: Sinus tachycardia -Imaging independently viewed and  interpreted by me and I agree with radiologist's interpretation.  Result remarkable for abd/pelvis CT without acute changes -This patient presents to the ED for concern of abdominal pain, this involves an extensive number of treatment options, and is a complaint that carries with it a high risk of complications and morbidity.  The differential diagnosis includes diverticulitis, colitis, PID, appendicitis, pyelonephritis, UTI, -Co morbidities that complicate the patient evaluation includes none -Treatment includes IV fluid, Zofran , morphine -Reevaluation of the patient after these medicines showed that the patient improved -PCP office notes or outside notes reviewed -Escalation to admission/observation considered: patients feels much better, is comfortable with discharge, and will follow up with PCP -Prescription medication considered, patient comfortable with tylenol  and zofran  -Social Determinant of Health considered  Suspect viral cause such as norovirus.  Recommend supportive care         Final Clinical Impression(s) / ED Diagnoses Final diagnoses:  Nausea vomiting and diarrhea    Rx / DC Orders ED Discharge Orders          Ordered    ondansetron  (ZOFRAN ) 4 MG tablet  Every 8 hours PRN        08/05/23 1653    acetaminophen  (TYLENOL ) 500 MG tablet  Every 6 hours PRN        08/05/23 1653              Nivia Colon, PA-C 08/05/23 1654    Franklyn Sid SAILOR, MD 08/06/23 [phone]

## 2023-08-10 ENCOUNTER — Encounter: Payer: Self-pay | Admitting: Pediatrics

## 2023-10-04 ENCOUNTER — Telehealth: Payer: Self-pay | Admitting: Pediatrics

## 2023-10-04 NOTE — Telephone Encounter (Signed)
 Called patient and left message to return call regarding sick appointment for bowel movement issues.
